# Patient Record
Sex: Male | Born: 1945 | Race: White | Hispanic: No | Marital: Married | Smoking: Never smoker
Health system: Southern US, Community
[De-identification: ages and names within clinical notes are randomized; demographics above are authoritative.]

## PROBLEM LIST (undated history)

## (undated) DIAGNOSIS — I10 Essential (primary) hypertension: Secondary | ICD-10-CM

## (undated) DIAGNOSIS — K219 Gastro-esophageal reflux disease without esophagitis: Secondary | ICD-10-CM

## (undated) DIAGNOSIS — E119 Type 2 diabetes mellitus without complications: Secondary | ICD-10-CM

## (undated) DIAGNOSIS — C801 Malignant (primary) neoplasm, unspecified: Secondary | ICD-10-CM

## (undated) DIAGNOSIS — E7800 Pure hypercholesterolemia, unspecified: Secondary | ICD-10-CM

## (undated) DIAGNOSIS — G4730 Sleep apnea, unspecified: Secondary | ICD-10-CM

## (undated) HISTORY — PX: EYE SURGERY: SHX253

## (undated) HISTORY — PX: LAPAROSCOPIC NEPHRECTOMY: SUR781

## (undated) HISTORY — PX: CHOLECYSTECTOMY: SHX55

---

## 1999-03-09 ENCOUNTER — Encounter: Admission: RE | Admit: 1999-03-09 | Discharge: 1999-06-07 | Payer: Self-pay | Admitting: Endocrinology

## 1999-05-26 ENCOUNTER — Ambulatory Visit: Admission: RE | Admit: 1999-05-26 | Discharge: 1999-05-26 | Payer: Self-pay

## 2001-12-23 ENCOUNTER — Encounter: Payer: Self-pay | Admitting: General Surgery

## 2001-12-23 ENCOUNTER — Observation Stay (HOSPITAL_COMMUNITY): Admission: RE | Admit: 2001-12-23 | Discharge: 2001-12-24 | Payer: Self-pay | Admitting: General Surgery

## 2005-03-31 ENCOUNTER — Emergency Department (HOSPITAL_COMMUNITY): Admission: EM | Admit: 2005-03-31 | Discharge: 2005-03-31 | Payer: Self-pay | Admitting: Emergency Medicine

## 2005-10-20 ENCOUNTER — Ambulatory Visit (HOSPITAL_COMMUNITY): Admission: RE | Admit: 2005-10-20 | Discharge: 2005-10-20 | Payer: Self-pay | Admitting: Family Medicine

## 2006-10-06 IMAGING — CT CT CHEST WO
3 of 4 series · 17 of 31 positions shown, 19 images · non-contrast
Comparison: none

______________________________________________________________
JUMPER, GIORGI 02/10/1937 ERXLEBEN, FERIENHAUS [DATE]

REASON FOR CONSULTATION: DENSITY [id number]
____________________________________________
EXAM: BILATERAL BREAST EXAM
BILATERAL MAMMOGRAM, 28 June, 1995:
Comparison is made to the study 19 June, 1994.
The breasts reveal an involutional pattern with a small amount of
residual dense fibroglandular tissue present in the upper outer
quadrants. I see no dominant masses nor malignant-appearing groupings
of microcalcification. No new architectural distortion nor skin
thickening is seen.

[Series 2: chest wo · axial · 0.85mm/px · z∈[+1289,+1594]mm · 8 of 83 slices shown, 10 images]
[im 11/83  mediastinal]
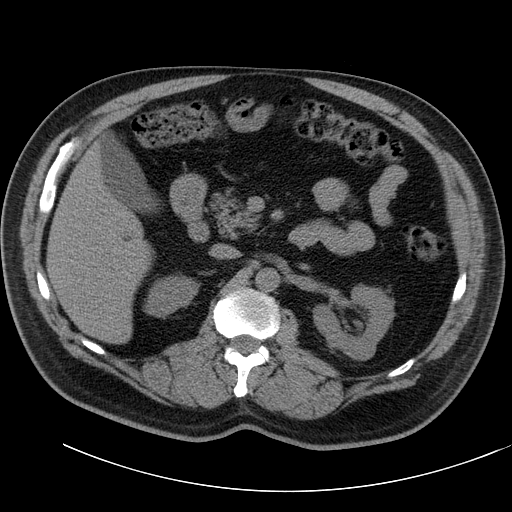
[im 11/83  lung]
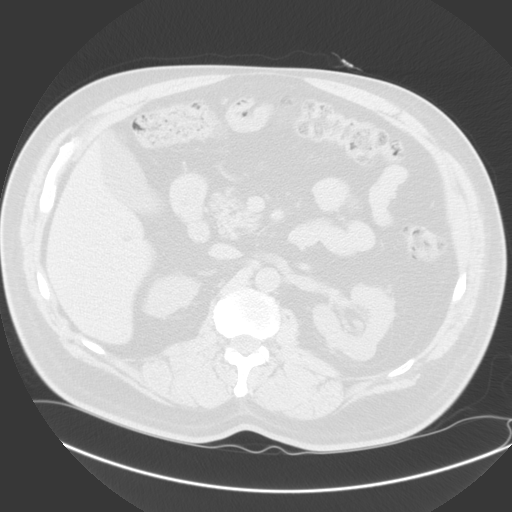
[im 21/83  lung]
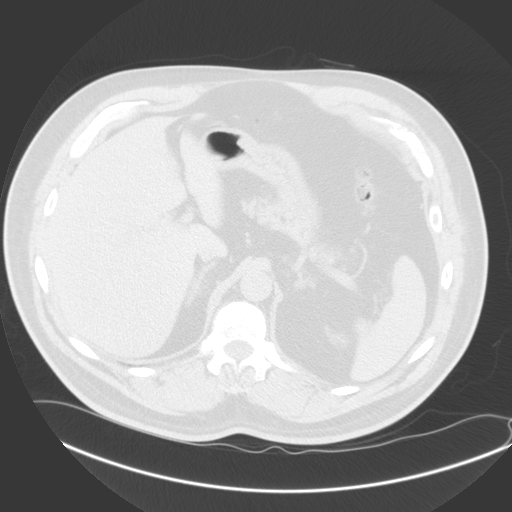
[im 31/83  lung]
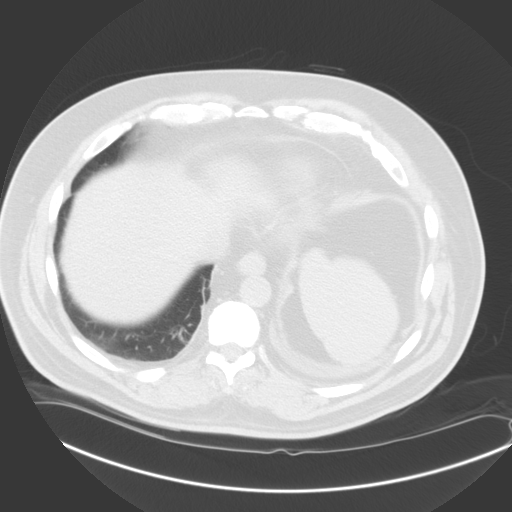
[im 40/83  lung]
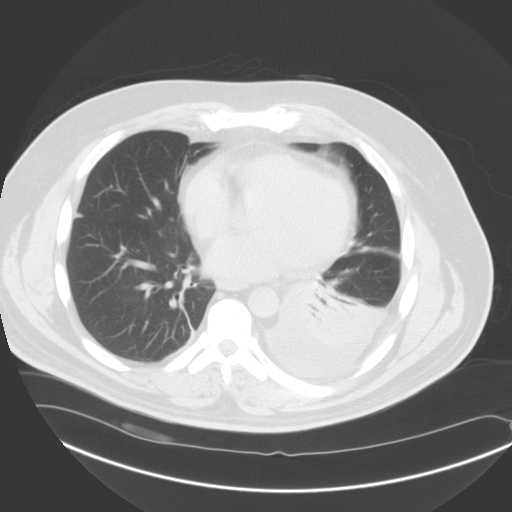
[im 42/83  mediastinal]
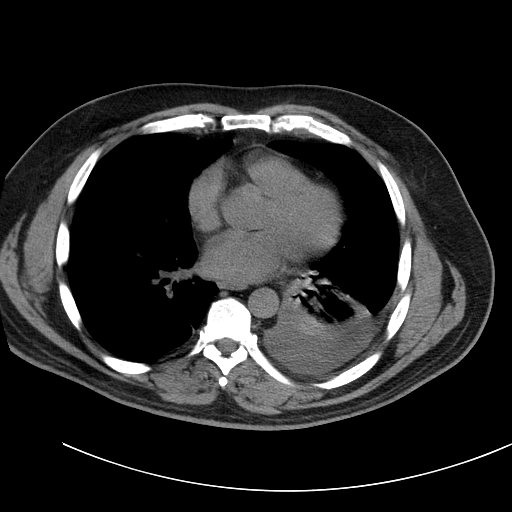
[im 42/83  lung]
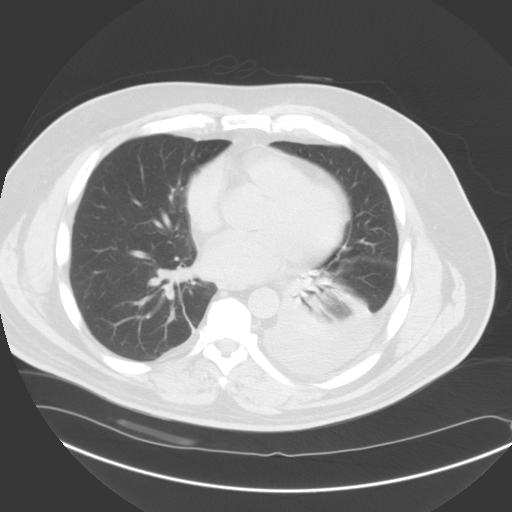
[im 52/83  lung]
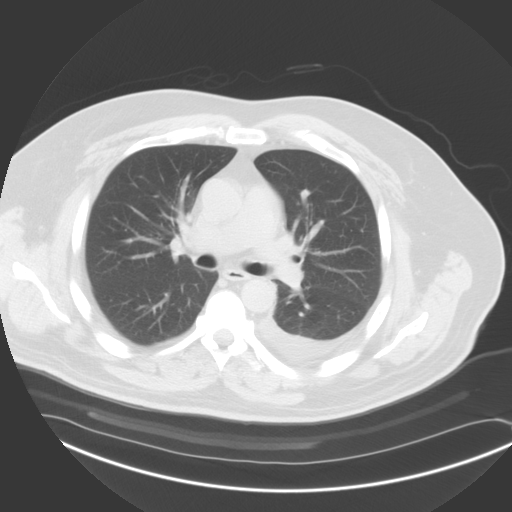
[im 62/83  lung]
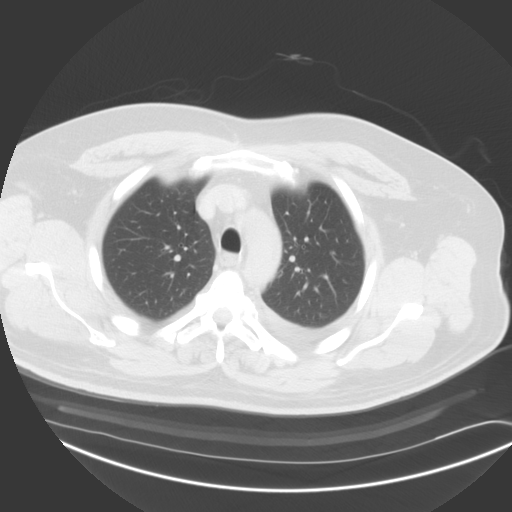
[im 72/83  lung]
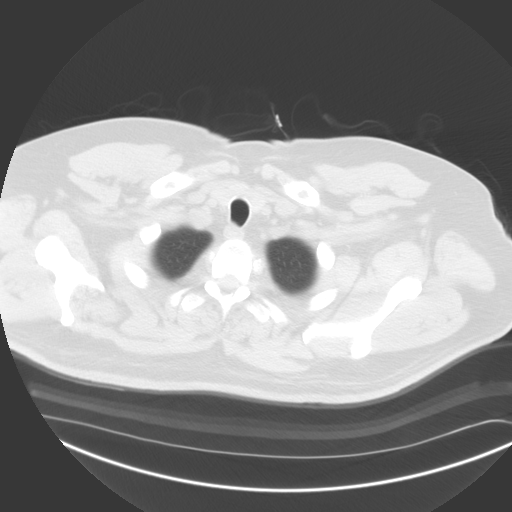

[Series 4: liver · axial · 0.85mm/px · 1 of 36 slices shown]
[im 12/36  lung]
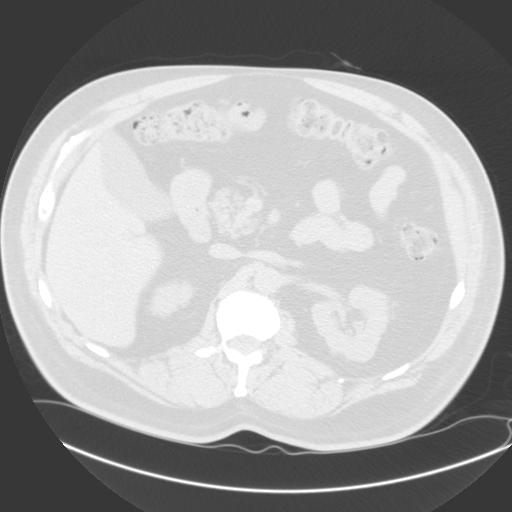

[Series 5: bone · axial · 0.85mm/px · z∈[+1289,+1594]mm · 8 of 83 slices shown]
[im 11/83  lung]
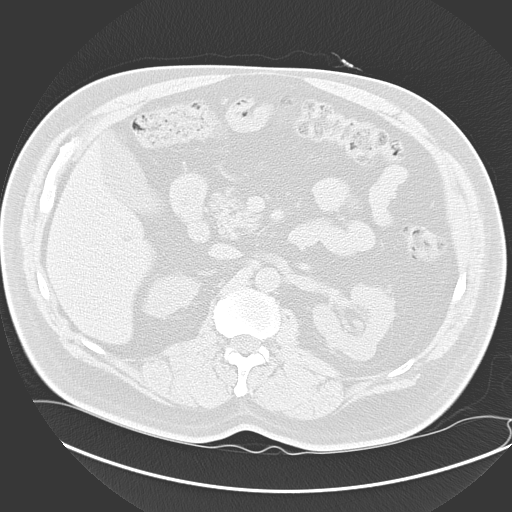
[im 21/83  lung]
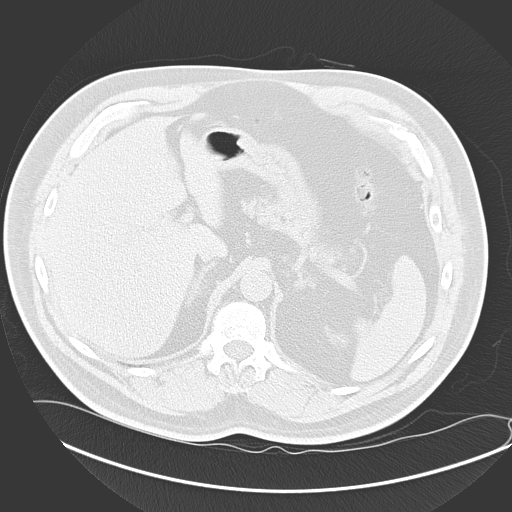
[im 31/83  lung]
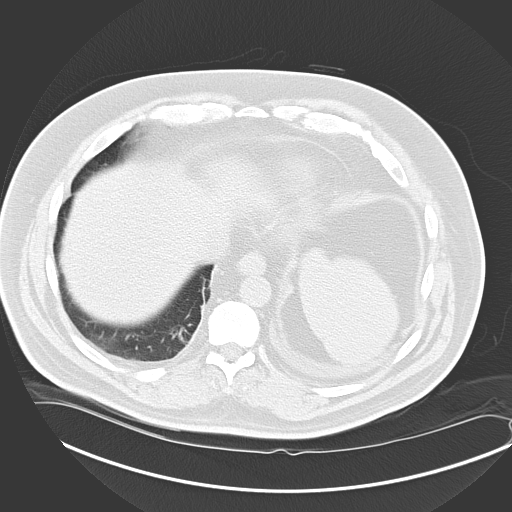
[im 40/83  lung]
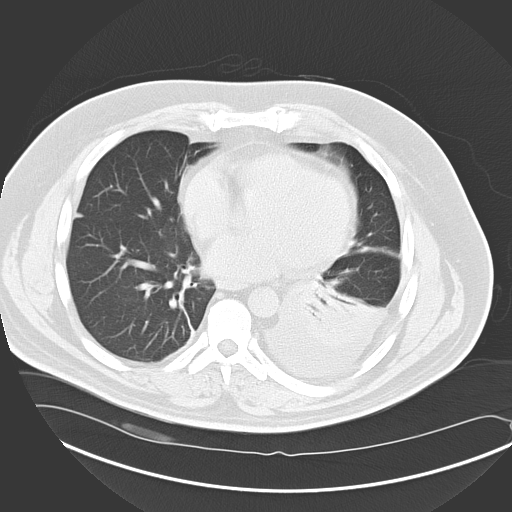
[im 42/83  lung]
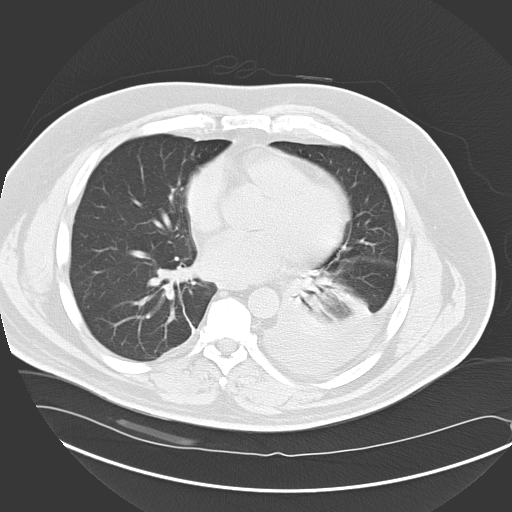
[im 52/83  lung]
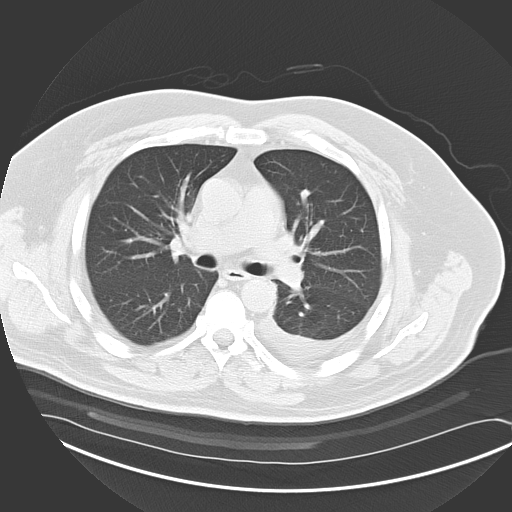
[im 62/83  lung]
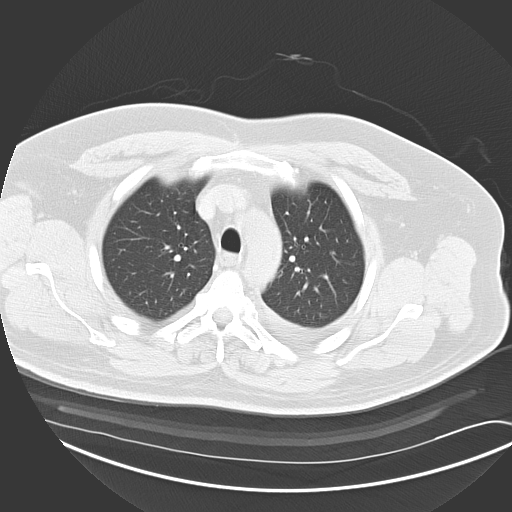
[im 72/83  lung]
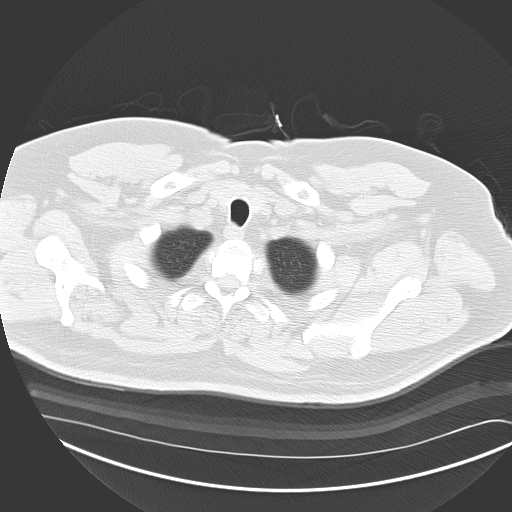

[17 of 31 positions shown; findings below may reference images not displayed]

IMPRESSION: The breasts appear stable and exhibit no findings suspicious
for malignancy.

Recommendation: Please continue to encourage yearly follow-up.

## 2015-09-14 DIAGNOSIS — Z85528 Personal history of other malignant neoplasm of kidney: Secondary | ICD-10-CM | POA: Insufficient documentation

## 2015-09-14 DIAGNOSIS — K8309 Other cholangitis: Secondary | ICD-10-CM | POA: Insufficient documentation

## 2015-09-14 DIAGNOSIS — I10 Essential (primary) hypertension: Secondary | ICD-10-CM | POA: Insufficient documentation

## 2015-09-14 DIAGNOSIS — E785 Hyperlipidemia, unspecified: Secondary | ICD-10-CM | POA: Insufficient documentation

## 2015-09-14 DIAGNOSIS — E119 Type 2 diabetes mellitus without complications: Secondary | ICD-10-CM | POA: Insufficient documentation

## 2015-09-14 DIAGNOSIS — K8050 Calculus of bile duct without cholangitis or cholecystitis without obstruction: Secondary | ICD-10-CM | POA: Insufficient documentation

## 2019-08-07 ENCOUNTER — Telehealth (INDEPENDENT_AMBULATORY_CARE_PROVIDER_SITE_OTHER): Payer: Self-pay | Admitting: Gastroenterology

## 2019-08-07 ENCOUNTER — Other Ambulatory Visit: Payer: Self-pay

## 2019-08-07 ENCOUNTER — Telehealth: Payer: Self-pay

## 2019-08-07 DIAGNOSIS — C649 Malignant neoplasm of unspecified kidney, except renal pelvis: Secondary | ICD-10-CM | POA: Insufficient documentation

## 2019-08-07 DIAGNOSIS — Z1211 Encounter for screening for malignant neoplasm of colon: Secondary | ICD-10-CM

## 2019-08-07 MED ORDER — NA SULFATE-K SULFATE-MG SULF 17.5-3.13-1.6 GM/177ML PO SOLN: 1.0000 | Freq: Once | ORAL | 0 refills | Status: AC

## 2019-08-07 NOTE — Progress Notes (Signed)
Gastroenterology Pre-Procedure Review  Request Date: Tuesday 09/02/19 Requesting Physician: Dr. Marius Ditch  PATIENT REVIEW QUESTIONS: The patient responded to the following health history questions as indicated:    1. Are you having any GI issues? no 2. Do you have a personal history of Polyps? no 3. Do you have a family history of Colon Cancer or Polyps? no 4. 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 Joint replacements in the past 12 months?no 6. Major health problems in the past 3 months?no 7. Any artificial heart valves, MVP, or defibrillator?no    MEDICATIONS & ALLERGIES:    Patient reports the following regarding taking any anticoagulation/antiplatelet therapy:   Plavix, Coumadin, Eliquis, Xarelto, Lovenox, Pradaxa, Brilinta, or Effient? no Aspirin? yes (81 mg daily)  Patient confirms/reports the following medications:  Current Outpatient Medications  Medication Sig Dispense Refill  . aspirin 81 MG EC tablet Take by mouth.    . benazepril (LOTENSIN) 40 MG tablet Take 40 mg by mouth daily.    . cyclobenzaprine (FLEXERIL) 10 MG tablet Take by mouth.    Marland Kitchen glipiZIDE (GLUCOTROL) 5 MG tablet Take by mouth.    . loratadine (CLARITIN) 10 MG tablet Take by mouth.    Marland Kitchen omeprazole (PRILOSEC) 20 MG capsule Take by mouth.    . pravastatin (PRAVACHOL) 40 MG tablet Take by mouth.    . tamsulosin (FLOMAX) 0.4 MG CAPS capsule Take by mouth.    . Na Sulfate-K Sulfate-Mg Sulf 17.5-3.13-1.6 GM/177ML SOLN Take 1 kit by mouth once for 1 dose. 354 mL 0   No current facility-administered medications for this visit.    Patient confirms/reports the following allergies:  No Known Allergies  No orders of the defined types were placed in this encounter.   AUTHORIZATION INFORMATION Primary Insurance: 1D#: Group #:  Secondary Insurance: 1D#: Group #:  SCHEDULE INFORMATION: Date: 0810/21 Time: [employer]

## 2019-08-07 NOTE — Telephone Encounter (Signed)
Returned patients wife's call.  I LVM to let them know that I will send a rx in the mail with the colonoscopy instructions, however since he is a veteran if we get more preps in before his procedure I will hold one for them.  Thanks,  [place], [place]

## 2019-08-29 ENCOUNTER — Other Ambulatory Visit
Admission: RE | Admit: 2019-08-29 | Discharge: 2019-08-29 | Disposition: A | Discharge status: Home / Self Care | Payer: No Typology Code available for payment source | Source: Ambulatory Visit | Attending: Gastroenterology | Admitting: Gastroenterology

## 2019-08-29 ENCOUNTER — Other Ambulatory Visit: Payer: Self-pay

## 2019-08-29 DIAGNOSIS — Z01812 Encounter for preprocedural laboratory examination: Secondary | ICD-10-CM | POA: Insufficient documentation

## 2019-08-29 DIAGNOSIS — Z20822 Contact with and (suspected) exposure to covid-19: Secondary | ICD-10-CM | POA: Diagnosis not present

## 2019-08-29 LAB — SARS CORONAVIRUS 2 (TAT 6-24 HRS): SARS Coronavirus 2: NEGATIVE

## 2019-09-01 ENCOUNTER — Telehealth: Payer: Self-pay

## 2019-09-01 NOTE — Telephone Encounter (Signed)
Patient wife is calling because he woke up this morning nausea and a lot of nasal congestion. He has had some cough. Patient wants to know if they should rescheduled the procedure for tomorrow. Informed wife that was up to the patient his covid test was negative on Friday. Patient states he will rescheduled for 09/23/2019. Informed wife he would go for COVID test on 09/19/2019. She verbalized understanding

## 2019-09-19 ENCOUNTER — Other Ambulatory Visit
Admission: RE | Admit: 2019-09-19 | Discharge: 2019-09-19 | Disposition: A | Discharge status: Home / Self Care | Payer: No Typology Code available for payment source | Source: Ambulatory Visit | Attending: Gastroenterology | Admitting: Gastroenterology

## 2019-09-19 ENCOUNTER — Other Ambulatory Visit: Payer: Self-pay

## 2019-09-19 DIAGNOSIS — Z20822 Contact with and (suspected) exposure to covid-19: Secondary | ICD-10-CM | POA: Insufficient documentation

## 2019-09-19 DIAGNOSIS — Z01812 Encounter for preprocedural laboratory examination: Secondary | ICD-10-CM | POA: Diagnosis present

## 2019-09-19 LAB — SARS CORONAVIRUS 2 (TAT 6-24 HRS): SARS Coronavirus 2: NEGATIVE

## 2019-09-23 ENCOUNTER — Ambulatory Visit: Payer: No Typology Code available for payment source | Admitting: Anesthesiology

## 2019-09-23 ENCOUNTER — Other Ambulatory Visit: Payer: Self-pay

## 2019-09-23 ENCOUNTER — Encounter: Payer: Self-pay | Admitting: Gastroenterology

## 2019-09-23 ENCOUNTER — Encounter
Admission: RE | Disposition: A | Discharge status: Home / Self Care | Payer: Self-pay | Source: Home / Self Care | Attending: Gastroenterology

## 2019-09-23 ENCOUNTER — Ambulatory Visit
Admission: RE | Admit: 2019-09-23 | Discharge: 2019-09-23 | Disposition: A | Discharge status: Home / Self Care | Payer: No Typology Code available for payment source | Attending: Gastroenterology | Admitting: Gastroenterology

## 2019-09-23 DIAGNOSIS — I10 Essential (primary) hypertension: Secondary | ICD-10-CM | POA: Diagnosis not present

## 2019-09-23 DIAGNOSIS — G4730 Sleep apnea, unspecified: Secondary | ICD-10-CM | POA: Diagnosis not present

## 2019-09-23 DIAGNOSIS — E119 Type 2 diabetes mellitus without complications: Secondary | ICD-10-CM | POA: Insufficient documentation

## 2019-09-23 DIAGNOSIS — Z7984 Long term (current) use of oral hypoglycemic drugs: Secondary | ICD-10-CM | POA: Diagnosis not present

## 2019-09-23 DIAGNOSIS — Z7982 Long term (current) use of aspirin: Secondary | ICD-10-CM | POA: Diagnosis not present

## 2019-09-23 DIAGNOSIS — Z79899 Other long term (current) drug therapy: Secondary | ICD-10-CM | POA: Insufficient documentation

## 2019-09-23 DIAGNOSIS — Z1211 Encounter for screening for malignant neoplasm of colon: Secondary | ICD-10-CM | POA: Diagnosis present

## 2019-09-23 DIAGNOSIS — Z85528 Personal history of other malignant neoplasm of kidney: Secondary | ICD-10-CM | POA: Insufficient documentation

## 2019-09-23 DIAGNOSIS — K219 Gastro-esophageal reflux disease without esophagitis: Secondary | ICD-10-CM | POA: Diagnosis not present

## 2019-09-23 DIAGNOSIS — E7800 Pure hypercholesterolemia, unspecified: Secondary | ICD-10-CM | POA: Insufficient documentation

## 2019-09-23 DIAGNOSIS — K644 Residual hemorrhoidal skin tags: Secondary | ICD-10-CM | POA: Diagnosis not present

## 2019-09-23 DIAGNOSIS — Z905 Acquired absence of kidney: Secondary | ICD-10-CM | POA: Diagnosis not present

## 2019-09-23 HISTORY — DX: Malignant (primary) neoplasm, unspecified: C80.1

## 2019-09-23 HISTORY — DX: Sleep apnea, unspecified: G47.30

## 2019-09-23 HISTORY — DX: Type 2 diabetes mellitus without complications: E11.9

## 2019-09-23 HISTORY — DX: Essential (primary) hypertension: I10

## 2019-09-23 HISTORY — DX: Gastro-esophageal reflux disease without esophagitis: K21.9

## 2019-09-23 HISTORY — DX: Pure hypercholesterolemia, unspecified: E78.00

## 2019-09-23 HISTORY — PX: COLONOSCOPY WITH PROPOFOL: SHX5780

## 2019-09-23 LAB — GLUCOSE, CAPILLARY: Glucose-Capillary: 139 mg/dL — ABNORMAL HIGH (ref 70–99)

## 2019-09-23 SURGERY — COLONOSCOPY WITH PROPOFOL
Anesthesia: General

## 2019-09-23 MED ORDER — PROPOFOL 500 MG/50ML IV EMUL
INTRAVENOUS | Status: AC
  Filled 2019-09-23: qty 50

## 2019-09-23 MED ORDER — PHENYLEPHRINE HCL (PRESSORS) 10 MG/ML IV SOLN
INTRAVENOUS | Status: DC | PRN
  Administered 2019-09-23 (×2): 100 ug via INTRAVENOUS

## 2019-09-23 MED ORDER — PROPOFOL 500 MG/50ML IV EMUL
INTRAVENOUS | Status: DC | PRN
  Administered 2019-09-23: 120 ug/kg/min via INTRAVENOUS

## 2019-09-23 MED ORDER — SODIUM CHLORIDE 0.9 % IV SOLN: INTRAVENOUS | Status: DC

## 2019-09-23 MED ORDER — PROPOFOL 10 MG/ML IV BOLUS
INTRAVENOUS | Status: DC | PRN
  Administered 2019-09-23: 100 mg via INTRAVENOUS

## 2019-09-23 NOTE — Op Note (Signed)
[facility] Gastroenterology Patient Name: Paul Gross Procedure Date: 09/23/2019 10:59 AM MRN: [id number] Account #: [id number] Date of Birth: 1945-06-20 Admit Type: Outpatient Age: 74 Room: [facility] ENDO ROOM 1 Gender: Male Note Status: Finalized Procedure:             Colonoscopy Indications:           Screening for colorectal malignant neoplasm Providers:             Lin Landsman MD, MD Referring MD:          Thorp (Referring MD) Medicines:             Monitored Anesthesia Care Complications:         No immediate complications. Estimated blood loss: None. Procedure:             Pre-Anesthesia Assessment:                        - Prior to the procedure, a History and Physical was                         performed, and patient medications and allergies were                         reviewed. The patient is competent. The risks and                         benefits of the procedure and the sedation options and                         risks were discussed with the patient. All questions                         were answered and informed consent was obtained.                         Patient identification and proposed procedure were                         verified by the physician, the nurse, the                         anesthesiologist, the anesthetist and the technician                         in the pre-procedure area in the procedure room in the                         endoscopy suite. Mental Status Examination: alert and                         oriented. Airway Examination: normal oropharyngeal                         airway and neck mobility. Respiratory Examination:                         clear to auscultation. CV Examination: normal.  Prophylactic Antibiotics: The patient does not require                         prophylactic antibiotics. Prior Anticoagulants: The                         patient has taken no  previous anticoagulant or                         antiplatelet agents. ASA Grade Assessment: III - A                         patient with severe systemic disease. After reviewing                         the risks and benefits, the patient was deemed in                         satisfactory condition to undergo the procedure. The                         anesthesia plan was to use monitored anesthesia care                         (MAC). Immediately prior to administration of                         medications, the patient was re-assessed for adequacy                         to receive sedatives. The heart rate, respiratory                         rate, oxygen saturations, blood pressure, adequacy of                         pulmonary ventilation, and response to care were                         monitored throughout the procedure. The physical                         status of the patient was re-assessed after the                         procedure.                        After obtaining informed consent, the colonoscope was                         passed under direct vision. Throughout the procedure,                         the patient's blood pressure, pulse, and oxygen                         saturations were monitored continuously. The  Colonoscope was introduced through the anus and                         advanced to the the cecum, identified by appendiceal                         orifice and ileocecal valve. The colonoscopy was                         performed without difficulty. The patient tolerated                         the procedure well. The quality of the bowel                         preparation was evaluated using the BBPS [facility] Bowel                         Preparation Scale) with scores of: Right Colon = 3,                         Transverse Colon = 3 and Left Colon = 3 (entire mucosa                         seen well with no residual staining, small  fragments                         of stool or opaque liquid). The total BBPS score                         equals 9. Findings:      Skin tags were found on perianal exam.      The entire examined colon appeared normal.      Non-bleeding external hemorrhoids were found during retroflexion. The       hemorrhoids were large. Impression:            - Perianal skin tags found on perianal exam.                        - The entire examined colon is normal.                        - Non-bleeding external hemorrhoids.                        - No specimens collected. Recommendation:        - Discharge patient to home (with escort).                        - Resume previous diet today.                        - Continue present medications.                        - Repeat colonoscopy in 10 years for screening                         purposes.  Procedure Code(s):     --- Professional ---                        B7628, Colorectal cancer screening; colonoscopy on                         individual not meeting criteria for high risk Diagnosis Code(s):     --- Professional ---                        K64.4, Residual hemorrhoidal skin tags                        Z12.11, Encounter for screening for malignant neoplasm                         of colon CPT copyright 2019 American Medical Association. All rights reserved. The codes documented in this report are preliminary and upon coder review may  be revised to meet current compliance requirements. Dr. Ulyess Mort Lin Landsman MD, MD 09/23/2019 11:25:31 AM This report has been signed electronically. Number of Addenda: 0 Note Initiated On: 09/23/2019 10:59 AM Scope Withdrawal Time: 0 hours 10 minutes 54 seconds  Total Procedure Duration: 0 hours 13 minutes 55 seconds  Estimated Blood Loss:  Estimated blood loss: none.      [facility]

## 2019-09-23 NOTE — Transfer of Care (Signed)
Immediate Anesthesia Transfer of Care Note  Patient: Paul Gross  Procedure(s) Performed: COLONOSCOPY WITH PROPOFOL (N/A )  Patient Location: Endoscopy Unit  Anesthesia Type:General  Level of Consciousness: drowsy and patient cooperative  Airway & Oxygen Therapy: Patient Spontanous Breathing  Post-op Assessment: Report given to RN and Post -op Vital signs reviewed and stable  Post vital signs: Reviewed and stable  Last Vitals:  Vitals Value Taken Time  BP 97/50 09/23/19 1128  Temp    Pulse 86 09/23/19 1130  Resp 22 09/23/19 1130  SpO2 96 % 09/23/19 1130  Vitals shown include unvalidated device data.  Last Pain:  Vitals:   09/23/19 1129  TempSrc:   PainSc: 0-No pain         Complications: No complications documented.

## 2019-09-23 NOTE — H&P (Signed)
Paul Darby, MD [address]  [place], [place] [address]  Main: [phone]  Fax: [phone] Pager: [phone]  Primary Care Physician:  Administration, [facility] Primary Gastroenterologist:  Dr. Cephas Gross  Pre-Procedure History & Physical: HPI:  Paul Gross is a 74 y.o. male is here for an colonoscopy.   Past Medical History:  Diagnosis Date  . Cancer [facility])    left kidney 2017  . Diabetes mellitus without complication ([place])   . GERD (gastroesophageal reflux disease)   . Hypercholesteremia   . Hypertension   . Sleep apnea     Past Surgical History:  Procedure Laterality Date  . CHOLECYSTECTOMY    . EYE SURGERY     cataract  . LAPAROSCOPIC NEPHRECTOMY      Prior to Admission medications   Medication Sig Start Date End Date Taking? Authorizing Provider  aspirin 81 MG EC tablet Take by mouth. 06/20/09   [provider]  benazepril (LOTENSIN) 40 MG tablet Take 40 mg by mouth daily.    [provider]  cyclobenzaprine (FLEXERIL) 10 MG tablet Take by mouth. 07/05/09   [provider]  glipiZIDE (GLUCOTROL) 5 MG tablet Take by mouth. 06/20/09   [provider]  loratadine (CLARITIN) 10 MG tablet Take by mouth.    [provider]  omeprazole (PRILOSEC) 20 MG capsule Take by mouth.    [provider]  pravastatin (PRAVACHOL) 40 MG tablet Take by mouth. 06/20/09   [provider]  tamsulosin (FLOMAX) 0.4 MG CAPS capsule Take by mouth.    [provider]    Allergies as of 08/07/2019  . (No Known Allergies)    History reviewed. No pertinent family history.  Social History   Socioeconomic History  . Marital status: Married    Spouse name: Not on file  . Number of children: Not on file  . Years of education: Not on file  . Highest education level: Not on file  Occupational History  . Not on file  Tobacco Use  . Smoking status: Never Smoker  . Smokeless tobacco:  Never Used  Vaping Use  . Vaping Use: Never used  Substance and Sexual Activity  . Alcohol use: Not Currently  . Drug use: Never  . Sexual activity: Not on file  Other Topics Concern  . Not on file  Social History Narrative  . Not on file   Social Determinants of Health   Financial Resource Strain:   . Difficulty of Paying Living Expenses: Not on file  Food Insecurity:   . Worried About Charity fundraiser in the Last Year: Not on file  . Ran Out of Food in the Last Year: Not on file  Transportation Needs:   . Lack of Transportation (Medical): Not on file  . Lack of Transportation (Non-Medical): Not on file  Physical Activity:   . Days of Exercise per Week: Not on file  . Minutes of Exercise per Session: Not on file  Stress:   . Feeling of Stress : Not on file  Social Connections:   . Frequency of Communication with Friends and Family: Not on file  . Frequency of Social Gatherings with Friends and Family: Not on file  . Attends Religious Services: Not on file  . Active Member of Clubs or Organizations: Not on file  . Attends Archivist Meetings: Not on file  . Marital Status: Not on file  Intimate Partner Violence:   . Fear of  Current or Ex-Partner: Not on file  . Emotionally Abused: Not on file  . Physically Abused: Not on file  . Sexually Abused: Not on file    Review of Systems: See HPI, otherwise negative ROS  Physical Exam: BP 138/87   Pulse 93   Temp 98 F (36.7 C) (Tympanic)   Resp 18   Ht 6' 1.5" (1.867 m)   Wt 120.2 kg   SpO2 98%   BMI 34.49 kg/m  General:   Alert,  pleasant and cooperative in NAD Head:  Normocephalic and atraumatic. Neck:  Supple; no masses or thyromegaly. Lungs:  Clear throughout to auscultation.    Heart:  Regular rate and rhythm. Abdomen:  Soft, nontender and nondistended. Normal bowel sounds, without guarding, and without rebound.   Neurologic:  Alert and  oriented x4;  grossly normal  neurologically.  Impression/Plan: [name] is here for an colonoscopy to be performed for colon cancer screening  Risks, benefits, limitations, and alternatives regarding  colonoscopy have been reviewed with the patient.  Questions have been answered.  All parties agreeable.   Sherri Sear, MD  09/23/2019, 10:46 AM

## 2019-09-23 NOTE — Anesthesia Postprocedure Evaluation (Signed)
Anesthesia Post Note  Patient: Paul Gross  Procedure(s) Performed: COLONOSCOPY WITH PROPOFOL (N/A )  Patient location during evaluation: Endoscopy Anesthesia Type: General Level of consciousness: awake and alert and oriented Pain management: pain level controlled Vital Signs Assessment: post-procedure vital signs reviewed and stable Respiratory status: spontaneous breathing, nonlabored ventilation and respiratory function stable Cardiovascular status: blood pressure returned to baseline and stable Postop Assessment: no signs of nausea or vomiting Anesthetic complications: no   No complications documented.   Last Vitals:  Vitals:   09/23/19 1140 09/23/19 1150  BP: (!) 100/52 109/71  Pulse: 86 72  Resp: (!) 21 14  Temp:    SpO2: 95% 98%    Last Pain:  Vitals:   09/23/19 1150  TempSrc:   PainSc: 0-No pain                 Kaelani Kendrick

## 2019-09-23 NOTE — Anesthesia Preprocedure Evaluation (Signed)
Anesthesia Evaluation  Patient identified by MRN, date of birth, ID band Patient awake    Reviewed: Allergy & Precautions, NPO status , Patient's Chart, lab work & pertinent test results  History of Anesthesia Complications Negative for: history of anesthetic complications  Airway Mallampati: III  TM Distance: >3 FB Neck ROM: Full    Dental  (+) Poor Dentition, Chipped   Pulmonary sleep apnea and Continuous Positive Airway Pressure Ventilation , neg COPD,    breath sounds clear to auscultation- rhonchi (-) wheezing      Cardiovascular hypertension, Pt. on medications (-) CAD, (-) Past MI, (-) Cardiac Stents and (-) CABG  Rhythm:Regular Rate:Normal - Systolic murmurs and - Diastolic murmurs    Neuro/Psych neg Seizures negative neurological ROS  negative psych ROS   GI/Hepatic Neg liver ROS, GERD  ,  Endo/Other  diabetes, Oral Hypoglycemic Agents  Renal/GU Renal disease (single kidney, s/p resection for renal cancer)     Musculoskeletal negative musculoskeletal ROS (+)   Abdominal (+) + obese,   Peds  Hematology negative hematology ROS (+)   Anesthesia Other Findings Past Medical History: No date: Cancer ([place])     Comment:  left kidney 2017 No date: Diabetes mellitus without complication ([facility]) No date: GERD (gastroesophageal reflux disease) No date: Hypercholesteremia No date: Hypertension No date: Sleep apnea   Reproductive/Obstetrics                             Anesthesia Physical Anesthesia Plan  ASA: III  Anesthesia Plan: General   Post-op Pain Management:    Induction: Intravenous  PONV Risk Score and Plan: 1 and Propofol infusion  Airway Management Planned: Natural Airway  Additional Equipment:   Intra-op Plan:   Post-operative Plan:   Informed Consent: I have reviewed the patients History and Physical, chart, labs and discussed the procedure including the risks,  benefits and alternatives for the proposed anesthesia with the patient or authorized representative who has indicated his/her understanding and acceptance.     Dental advisory given  Plan Discussed with: CRNA and Anesthesiologist  Anesthesia Plan Comments:         Anesthesia Quick Evaluation

## 2019-09-24 ENCOUNTER — Encounter: Payer: Self-pay | Admitting: Gastroenterology

## 2021-08-02 ENCOUNTER — Other Ambulatory Visit: Payer: Self-pay | Admitting: Cardiology

## 2021-08-02 ENCOUNTER — Other Ambulatory Visit (HOSPITAL_COMMUNITY): Payer: Self-pay | Admitting: Cardiology

## 2021-08-02 DIAGNOSIS — I3139 Other pericardial effusion (noninflammatory): Secondary | ICD-10-CM

## 2021-10-18 ENCOUNTER — Telehealth (HOSPITAL_COMMUNITY): Payer: Self-pay | Admitting: Emergency Medicine

## 2021-10-18 NOTE — Telephone Encounter (Signed)
Reaching out to patient to offer assistance regarding upcoming cardiac imaging study; pt verbalizes understanding of appt date/time, parking situation and where to check in, pre-test NPO status and medications ordered, and verified current allergies; name and call back number provided for further questions should they arise Paul Bond RN Navigator Cardiac Imaging [facility] [phone] office [phone] cell  Arrival 330 w/c entrnace Denies iv issues Denies metal Denies claustro Holding lasix

## 2021-10-19 ENCOUNTER — Other Ambulatory Visit (HOSPITAL_COMMUNITY): Payer: Self-pay | Admitting: Cardiology

## 2021-10-19 ENCOUNTER — Ambulatory Visit (HOSPITAL_COMMUNITY)
Admission: RE | Admit: 2021-10-19 | Discharge: 2021-10-19 | Disposition: A | Discharge status: Home / Self Care | Payer: No Typology Code available for payment source | Source: Ambulatory Visit | Attending: Cardiology | Admitting: Cardiology

## 2021-10-19 DIAGNOSIS — I3139 Other pericardial effusion (noninflammatory): Secondary | ICD-10-CM | POA: Diagnosis present

## 2021-10-19 MED ORDER — GADOBUTROL 1 MMOL/ML IV SOLN
10.0000 mL | Freq: Once | INTRAVENOUS | Status: AC | PRN
  Administered 2021-10-19: 10 mL via INTRAVENOUS
